# Patient Record
Sex: Female | Born: 2000 | Hispanic: Yes | Marital: Single | Smoking: Never smoker
Health system: Southern US, Community
[De-identification: ages and names within clinical notes are randomized; demographics above are authoritative.]

## PROBLEM LIST (undated history)

## (undated) HISTORY — PX: CARDIAC SURGERY: SHX584

---
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
## 2018-11-09 ENCOUNTER — Other Ambulatory Visit: Payer: Self-pay

## 2018-11-09 ENCOUNTER — Emergency Department
Admission: EM | Admit: 2018-11-09 | Discharge: 2018-11-09 | Disposition: A | Discharge status: Home / Self Care | Payer: BLUE CROSS/BLUE SHIELD | Attending: Emergency Medicine | Admitting: Emergency Medicine

## 2018-11-09 DIAGNOSIS — R519 Headache, unspecified: Secondary | ICD-10-CM

## 2018-11-09 DIAGNOSIS — R51 Headache: Secondary | ICD-10-CM | POA: Diagnosis not present

## 2018-11-09 MED ORDER — SODIUM CHLORIDE 0.9 % IV SOLN
1000.0000 mL | Freq: Once | INTRAVENOUS | Status: AC
  Administered 2018-11-09: 1000 mL via INTRAVENOUS

## 2018-11-09 MED ORDER — BUTALBITAL-APAP-CAFFEINE 50-325-40 MG PO TABS: 1.0000 | ORAL_TABLET | Freq: Four times a day (QID) | ORAL | 0 refills | Status: AC | PRN

## 2018-11-09 MED ORDER — METOCLOPRAMIDE HCL 5 MG/ML IJ SOLN
20.0000 mg | Freq: Once | INTRAVENOUS | Status: AC
  Administered 2018-11-09: 20 mg via INTRAVENOUS
  Filled 2018-11-09: qty 4

## 2018-11-09 MED ORDER — DIPHENHYDRAMINE HCL 50 MG/ML IJ SOLN
25.0000 mg | Freq: Once | INTRAMUSCULAR | Status: AC
  Administered 2018-11-09: 25 mg via INTRAVENOUS
  Filled 2018-11-09: qty 1

## 2018-11-09 MED ORDER — KETOROLAC TROMETHAMINE 30 MG/ML IJ SOLN
30.0000 mg | Freq: Once | INTRAMUSCULAR | Status: AC
  Administered 2018-11-09: 30 mg via INTRAVENOUS
  Filled 2018-11-09: qty 1

## 2018-11-09 NOTE — ED Triage Notes (Signed)
Headache since Wednesday. Speaking in complete sentences. A&O, ambulatory. Has taken OTC meds without relief.

## 2018-11-09 NOTE — ED Provider Notes (Signed)
[facility] Emergency Department Provider Note   ____________________________________________    I have reviewed the triage vital signs and the nursing notes.   HISTORY  Chief Complaint Headache     HPI Christina Allison is a 18 y.o. female who presents with complaints of headache.  Patient describes global throbbing headache which is been ongoing for several days, she denies fevers or chills, she does report photosensitivity.  Denies a history of migraines.  No neuro deficits.  No vomiting, occasional nausea.  Has taken over-the-counter medications with little improvement.  No trauma.  No past medical history.  History reviewed. No pertinent past medical history.  There are no active problems to display for this patient.   Past Surgical History:  Procedure Laterality Date  . CARDIAC SURGERY      Prior to Admission medications   Medication Sig Start Date End Date Taking? Authorizing Provider  butalbital-acetaminophen-caffeine (FIORICET, ESGIC) [phone] MG tablet Take 1-2 tablets by mouth every 6 (six) hours as needed for headache. 11/09/18 11/09/19  Jene Every, MD     Allergies Penicillins  History reviewed. No pertinent family history.  Social History Social History   Tobacco Use  . Smoking status: Never Smoker  Substance Use Topics  . Alcohol use: Never    Frequency: Never  . Drug use: Not on file    Review of Systems  Constitutional: No fevers Eyes: No visual changes.  ENT: No neck pain Cardiovascular: Denies chest pain. Respiratory: Denies shortness of breath. Gastrointestinal: No abdominal pain.   Genitourinary: Negative for dysuria. Musculoskeletal: Negative for back pain. Skin: Negative for rash. Neurological: As above   ____________________________________________   PHYSICAL EXAM:  VITAL SIGNS: ED Triage Vitals  Enc Vitals Group     BP 11/09/18 1853 110/83     Pulse Rate 11/09/18 1853 73     Resp  11/09/18 1853 16     Temp 11/09/18 1853 98.5 F (36.9 C)     Temp Source 11/09/18 1853 Oral     SpO2 11/09/18 1853 100 %     Weight 11/09/18 1854 65.8 kg (145 lb)     Height 11/09/18 1854 1.626 m (5\' 4" )     Head Circumference --      Peak Flow --      Pain Score 11/09/18 1853 7     Pain Loc --      Pain Edu? --      Excl. in GC? --     Constitutional: Alert and oriented.  Eyes: Conjunctivae are normal.  PERRLA, EOMI Head: Atraumatic. Nose: No congestion/rhinnorhea. Mouth/Throat: Mucous membranes are moist.   Neck:  Painless ROM Cardiovascular: Normal rate, regular rhythm. Grossly normal heart sounds.  Good peripheral circulation. Respiratory: Normal respiratory effort.  No retractions. Lungs CTAB. Neurologic:  Normal speech and language. No gross focal neurologic deficits are appreciated.  Cranial nerves II through XII are normal Skin:  Skin is warm, dry and intact. No rash noted. Psychiatric: Mood and affect are normal. Speech and behavior are normal.  ____________________________________________   LABS (all labs ordered are listed, but only abnormal results are displayed)  Labs Reviewed - No data to display ____________________________________________  EKG  None ____________________________________________  RADIOLOGY  None ____________________________________________   PROCEDURES  Procedure(s) performed: No  Procedures   Critical Care performed: No ____________________________________________   INITIAL IMPRESSION / ASSESSMENT AND PLAN / ED COURSE  Pertinent labs & imaging results that were available during my care of the patient were  reviewed by me and considered in my medical decision making (see chart for details).  Patient presents with global throbbing headache, with photophobia, no neuro deficits or alarming symptoms most concerning for migraine headache.  Will treat with Toradol, Benadryl, Reglan and IV fluids and  reevaluate  ----------------------------------------- 9:43 PM on 11/09/2018 -----------------------------------------  Patient has had significant improvement in headache, appropriate for discharge at this time.  Recommend outpatient follow-up with PCP if continued headaches    ____________________________________________   FINAL CLINICAL IMPRESSION(S) / ED DIAGNOSES  Final diagnoses:  Acute nonintractable headache, unspecified headache type        Note:  This document was prepared using Dragon voice recognition software and may include unintentional dictation errors.   Jene Every, MD 11/09/18 [phone]
